# Patient Record
Sex: Female | Born: 2000 | Race: White | Hispanic: No | Marital: Single
Health system: Midwestern US, Community
[De-identification: ages and names within clinical notes are randomized; demographics above are authoritative.]

## PROBLEM LIST (undated history)

## (undated) DIAGNOSIS — J101 Influenza due to other identified influenza virus with other respiratory manifestations: Secondary | ICD-10-CM

## (undated) DIAGNOSIS — F329 Major depressive disorder, single episode, unspecified: Secondary | ICD-10-CM

## (undated) DIAGNOSIS — F32A Depression, unspecified: Secondary | ICD-10-CM

## (undated) HISTORY — DX: Depression, unspecified: F32.A

## (undated) HISTORY — PX: OTHER SURGICAL HISTORY: SHX169

## (undated) HISTORY — DX: Major depressive disorder, single episode, unspecified: F32.9

---
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
## 2022-02-25 NOTE — ED Notes (Signed)
Formatting of this note might be different from the original.  Pt name and dob verified.     Pt sts on Saturday that she had multiple episodes of vomiting. Since then the pt has continued to develop more cold/flu-like symptoms. Pt sts this morning when she woke up that her right eye was swollen shut. Pt's sts she can now see out of the eye.     NAD at this time. Skin w/p/d. RR are even and unlabored.   Electronically signed by Jannet Askew, RN at 02/25/2022  1:40 PM EDT

## 2022-02-25 NOTE — ED Provider Notes (Signed)
Formatting of this note is different from the original.     TRIAGE CHIEF COMPLAINT:  Chief Complaint   Patient presents with    URI    Vomiting     HPI: Leah Love is a 21 year old female who presents to the emergency department with complaints of cough and congestion.  Patient states that over the past 2 days she has had worsening of her seasonal allergies with increased amounts of nasal discharge as well as some nausea and vomiting.  States that she has a hard time sleeping due to how much she is coughing as well as her congestion.  She states she has been taking Benadryl as well as another allergy medicine that she does not know what is called.  She states also she has been working a lot working double shifts at work and not sleeping as much.  No complaints of fevers or chills.  No chest pain or trouble breathing.    External Records Reviewed: None    REVIEW OF SYSTEMS: Otherwise negative unless stated above    PAST MEDICAL HISTORY:   History reviewed. No pertinent past medical history.    FAMILY HISTORY:   History reviewed. No pertinent family history.    SOCIAL HISTORY:   Social History     Socioeconomic History    Marital status: Single     Spouse name: Not on file    Number of children: Not on file    Years of education: Not on file    Highest education level: Not on file   Occupational History    Not on file   Tobacco Use    Smoking status: Never    Smokeless tobacco: Never   Vaping Use    Vaping Use: Not on file   Substance and Sexual Activity    Alcohol use: Not Currently     Alcohol/week: 2.0 standard drinks     Types: 2 Standard drinks or equivalent per week    Drug use: Yes     Types: Marijuana    Sexual activity: Not on file   Other Topics Concern    Military Service No    Blood Transfusions No    Caffeine Concern No    Occupational Exposure No    Hobby Hazards No    Sleep Concern No    Stress Concern No    Weight Concern No    Special Diet No    Back Care No    Exercise Yes    Bike Helmet No     Seat Belt Yes    Self-Exams No   Social History Narrative          Social History:      Mother's Name and Occupation: Stage manager 31 Deceased    Mother's Education Level:      Father's Name and Occupation: Unknown    Father's Education Level:      Parent's Current Relationship:    Who does the child primarily live with? Maternal Grandmother      List all people living in child's household and relationship to child:Maternal Grandmother Maternal Uncle and Siblings      What is the current living environment (house, apartment, or homeless) and year dwelling built: House-[birth year]22      What is the current child care arrangement? None      Have there been any recent stresses in the family? Mother passed away 07/17/10     What is the source of  drinking water at the home where the child lives? Bottled/Purifier      Does anyone who lives in your house smoke? No         Social Determinants of Psychologist, prison and probation services Strain: Not on file   Food Insecurity: Not on file   Transportation Needs: Not on file   Physical Activity: Not on file   Stress: Not on file   Social Connections: Not on file   Intimate Partner Violence: Not on file   Housing Stability: Not on file     SURGICAL HISTORY:   History reviewed. No pertinent surgical history.    CURRENT MEDICATIONS:   No current facility-administered medications for this encounter.    Current Outpatient Medications:     vitamins multiple pediatric (FLINTSTONES)  Chew Tab, Take 1 Tab by mouth daily, Disp: , Rfl:     ALLERGIES: Patient has no known allergies.    PHYSICAL EXAM:  VITAL SIGNS:    Vitals:    02/25/22 1339   BP: 127/78   Pulse: 72   Resp: 16   Temp: 98.4 F (36.9 C)   SpO2: 98%     CONSTITUTIONAL: Awake, oriented, appears non-toxic  HENT: Atraumatic, normocephalic, oral mucosa pink and moist, airway patent. Nares patent without drainage. External ears normal.  EYES: Conjunctiva clear   NECK: Trachea midline, non-tender, supple  CARDIOVASCULAR: Normal heart rate,  Normal rhythm, No murmurs, rubs, gallops  PULMONARY/CHEST: Clear to auscultation, no rhonchi, wheezes, or rales. Symmetrical breath sounds. Non-tender.  ABDOMINAL: Non-distended, soft, non-tender - no rebound or guarding.    NEUROLOGIC: Non-focal, moving all four extremities, no gross sensory or motor deficits.  EXTREMITIES: No clubbing, cyanosis, or edema  SKIN: Warm, Dry, No erythema, No rash    ED COURSE / MEDICAL DECISION MAKING:  Leah Love is a 21 year old female who presents to the emergency department with complaints of URI type symptoms.  On arrival she is well-appearing and has reassuring vital signs.  On physical exam her oropharynx is moist, tonsils are not swollen, uvula is midline, tolerating secretions, no lymphadenopathy, lung sounds are clear bilaterally.  We will offer her treatment with Tylenol Motrin at home as well as prescription of Flonase.  We will also give her number to call for primary care doctor.  Concerns are for allergies versus URI.  Will discharge home in stable condition.      Independent Interpretation of Studies: None  Discussion of Management: None  Escalation/De-Escalation of Care: Appropriate for outpatient management  Billable Critical Care Time: None or <30 minutes    FINAL IMPRESSION:  1 --URI  2 --seasonal allergies  3 --     Electronically signed by: Dicky Doe, MD, 02/25/2022 2:40 PM    Electronically signed by Dicky Doe, MD at 02/25/2022  2:42 PM EDT

## 2022-02-25 NOTE — ED Notes (Signed)
Formatting of this note might be different from the original.  Patient discharged to home, alert and oriented, skin warm, dry and pink. Denies needs and or questions. Will follow-up as directed, patient encouraged to return for worsening or new symptoms or other concerns.  Discussed, educated and counseled with the patient, and any questioned answered and addressed.  Patient ambulated out by self.   Electronically signed by Isaias Sakai, RN at 02/25/2022  3:11 PM EDT

## 2022-06-27 ENCOUNTER — Inpatient Hospital Stay
Admit: 2022-06-27 | Discharge: 2022-06-27 | Disposition: A | Discharge status: Home / Self Care | Payer: MEDICAID | Attending: Emergency Medicine

## 2022-06-27 DIAGNOSIS — J101 Influenza due to other identified influenza virus with other respiratory manifestations: Secondary | ICD-10-CM

## 2022-06-27 LAB — COVID-19, RAPID: SARS-CoV-2, NAAT: NOT DETECTED

## 2022-06-27 LAB — INFLUENZA A/B, MOLECULAR
Influenza A Antigen: DETECTED — AB
Influenza B Antigen: NOT DETECTED

## 2022-06-27 MED ORDER — ACETAMINOPHEN 500 MG PO TABS
500 MG | ORAL_TABLET | Freq: Four times a day (QID) | ORAL | 0 refills | Status: AC | PRN
Start: 2022-06-27 — End: ?

## 2022-06-27 MED ORDER — IBUPROFEN 600 MG PO TABS
600 MG | ORAL_TABLET | Freq: Three times a day (TID) | ORAL | 0 refills | Status: AC | PRN
Start: 2022-06-27 — End: ?

## 2022-06-27 MED ORDER — IBUPROFEN 400 MG PO TABS
400 MG | Freq: Once | ORAL | Status: AC
Start: 2022-06-27 — End: 2022-06-27
  Administered 2022-06-27: 21:00:00 600 mg via ORAL

## 2022-06-27 MED ORDER — ACETAMINOPHEN 325 MG PO TABS
325 MG | Freq: Once | ORAL | Status: AC
Start: 2022-06-27 — End: 2022-06-27
  Administered 2022-06-27: 21:00:00 650 mg via ORAL

## 2022-06-27 MED FILL — IBUPROFEN 400 MG PO TABS: 400 MG | ORAL | Qty: 2

## 2022-06-27 MED FILL — ACETAMINOPHEN 325 MG PO TABS: 325 MG | ORAL | Qty: 2

## 2022-06-27 NOTE — ED Provider Notes (Signed)
Emergency Department Encounter    Patient: Leah Love  MRN: [id number]  DOB: [birth year]  Date of Evaluation: 06/27/2022  ED Provider:  Valerie Roys, DO    Triage Chief Complaint:   Cough and Generalized Body Aches    HOPI:  Leah Love is a 21 y.o. female with no chronic medical illnesses that presents to the emergency department complaining of 2 days of bodyaches sore throat runny nose earache.  Patient initially also had a productive cough.  Patient states she has not taken anything for the pain or cough.  She denies any sick contacts.  States no chest pain shortness of breath dysuria hematuria.  Patient here for evaluation.    ROS - see HPI, below listed is current ROS at time of my eval:  General:  No fevers, no chills, no weakness  Eyes:  No recent vison changes, no discharge  ENT: Positive for runny nose, earache, sore throat, nasal congestion, no hearing changes  Cardiovascular:  No chest pain, no palpitations  Respiratory:  No shortness of breath, positive for cough, no wheezing  Gastrointestinal:  No pain, no nausea, no vomiting, no diarrhea  Musculoskeletal:  No muscle pain, no joint pain  Skin:  No rash, no pruritis, no easy bruising  Neurologic:  No speech problems, no headache, no extremity numbness, no extremity tingling, no extremity weakness  Psychiatric:  No anxiety  Genitourinary:  No dysuria, no hematuria  Endocrine:  No unexpected weight gain, no unexpected weight loss  Extremities:  no edema, no pain    Past Medical History:   Diagnosis Date    Fracture     right wrist     No past surgical history on file.  No family history on file.  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    Sexual activity: Not on file   Other Topics Concern    Not on file   Social History Narrative    Not on file     Social Determinants of Health     Financial Resource Strain: Not on file   Food Insecurity: Not on file   Transportation Needs: Not on file   Physical Activity: Not on file   Stress: Not on file   Social Connections: Not on file   Intimate Partner Violence: Not on file   Housing Stability: Not on file     Current Facility-Administered Medications   Medication Dose Route Frequency Provider Last Rate Last Admin    ibuprofen (ADVIL;MOTRIN) tablet 600 mg  600 mg Oral Once Crystallynn Noorani Tiara, DO        acetaminophen (TYLENOL) tablet 650 mg  650 mg Oral Once Lenor Derrick Tiara, DO         No current outpatient  medications on file.     No Known Allergies    Nursing Notes Reviewed    Physical Exam:  Triage VS:    ED Triage Vitals [06/27/22 1442]   Enc Vitals Group      BP (!) 146/87      Pulse 99      Respirations 16      Temp 98.4 F (36.9 C)      Temp src       SpO2 99 %      Weight - Scale 66.2 kg (146 lb)      Height 1.575 m (5\' 2" )      Head Circumference       Peak Flow       Pain Score       Pain Loc       Pain Edu?       Excl. in [place]?        My pulse ox interpretation is - normal    General appearance:  No acute distress.  Nontoxic, non-ill-appearing  Skin:  Warm. Dry.   Eye:  Extraocular movements intact.     Ears, nose, mouth and throat:  Oral mucosa moist mild erythema posterior oropharynx no exudates, normal tympanic membranes bilaterally,  Neck:  Trachea midline.   Extremity:  No swelling.  Normal ROM     Heart:  Regular rate and rhythm, normal S1 & S2, no extra heart sounds.    Perfusion:  intact  Respiratory:  Lungs clear to auscultation bilaterally.  Respirations nonlabored.     Abdominal:  Normal bowel sounds.  Soft.   Nontender.  Non distended.  Back:  No CVA tenderness to palpation     Neurological:  Alert and oriented times 3.  No focal neuro deficits.             Psychiatric:  Appropriate    I have reviewed and interpreted all of the currently available lab results from this visit (if applicable):  Results for orders placed or performed during the hospital encounter of 06/27/22   COVID-19, Rapid    Specimen: Nasopharyngeal   Result Value Ref Range    Source NARES     SARS-CoV-2, NAAT NOT DETECTED NOT DETECTED   Influenza A/B, Molecular    Specimen: Nasal   Result Value Ref Range    Influenza A Antigen DETECTED (A) NOT DETECTED    Influenza B Antigen NOT DETECTED NOT DETECTED   Strep Screen Group A  - Throat    Specimen: Throat   Result Value Ref Range    Specimen THROAT     Special Requests NONE     Strep A Direct Screen NEGATIVE       Radiographs (if obtained):  Radiologist's Report Reviewed:  No results found.    EKG (if obtained): (All EKG's are interpreted by myself in the absence of a cardiologist)    Medications   ibuprofen (ADVIL;MOTRIN) tablet 600 mg (600 mg Oral Given 06/27/22 1543)   acetaminophen (TYLENOL) tablet 650 mg (650 mg Oral Given 06/27/22 1543)         MDM:  Leah Love is a 21 y.o. female with no chronic medical illnesses that presents to the emergency department complaining of 2 days of bodyaches sore throat runny nose earache.  Patient initially also had a productive cough.  Patient states she has not taken anything for the pain or cough.  She denies any sick contacts.  States no chest pain shortness of  breath dysuria hematuria.    On physical exam patient nontoxic non-ill-appearing no acute distress.  Patient patent oropharynx mild erythema no exudates, normal tympanic membranes bilaterally, normal nasal turbinates bilaterally.    Differential diagnose include COVID-19 virus infection influenza strep pharyngitis.    Patient was ordered Tylenol 650 mg p.o., ibuprofen 600 mg p.o., COVID influenza  and strep swabs.  Patient did get relief with above medication.  Patient positive for influenza A.  Discussed with patient she is positive.  She will be given a prescription for Tylenol 500 mg p.o. every 4 hours as needed for pain alternate with Motrin 600 mg p.o. every 6-8 hours as needed for pain.  Patient to increase p.o. fluids to prevent dehydration.  Patient is return immediately worsening symptoms.  All questions answered.  Patient discharged in stable condition.    Clinical Impression:  1. Influenza A      Disposition referral (if applicable):  Inge Rise, MD  [address]  [address]  [place] [address]  [phone]    Schedule an appointment as soon as possible for a visit in 4 days  To establish a primary care physician for reevaluation.  Call for an appointment.    [facility]  [address]  [place] [address]  [phone]  Go in 1 day  If symptoms worsen    Disposition medications (if applicable):  New Prescriptions    ACETAMINOPHEN (TYLENOL) 500 MG TABLET    Take 1 tablet by mouth 4 times daily as needed for Pain    IBUPROFEN (ADVIL;MOTRIN) 600 MG TABLET    Take 1 tablet by mouth 3 times daily as needed for Pain     ED Provider Disposition Time  DISPOSITION  4:11 PM        Comment: Please note this report has been produced using speech recognition software and may contain errors related to that system including errors in grammar, punctuation, and spelling, as well as words and phrases that may be inappropriate.  Efforts were made to edit the dictations.        Valerie Roys, DO  06/27/22 [phone]

## 2022-06-27 NOTE — Discharge Instructions (Signed)
Establish and follow-up with a primary care physician Dr. Posey Pronto for reevaluation.  Call for an appointment  Take Tylenol alternate with Motrin for any pain aches and fevers  Increase p.o. fluid to prevent dehydration  Return to the emergency department immediately any pain fever chills nausea vomiting dizzy lightheadedness or worsening symptoms.

## 2022-06-30 LAB — STREP SCREEN GROUP A THROAT
Culture: NORMAL
Strep A Direct Screen: NEGATIVE

## 2023-02-25 ENCOUNTER — Telehealth: Payer: Self-pay | Admitting: Family Medicine

## 2023-02-25 NOTE — Telephone Encounter (Signed)
Called patient to schedule next available cpe, no answer left Vm.

## 2023-03-19 ENCOUNTER — Inpatient Hospital Stay
Admit: 2023-03-19 | Discharge: 2023-03-19 | Disposition: A | Discharge status: Home / Self Care | Payer: MEDICAID | Attending: Student in an Organized Health Care Education/Training Program

## 2023-03-19 DIAGNOSIS — J069 Acute upper respiratory infection, unspecified: Secondary | ICD-10-CM

## 2023-03-19 DIAGNOSIS — U071 COVID-19: Secondary | ICD-10-CM

## 2023-03-19 LAB — COVID-19, RAPID: SARS-CoV-2, Rapid: DETECTED — AB

## 2023-03-19 MED ORDER — ONDANSETRON 4 MG PO TBDP
4 | Freq: Once | ORAL | Status: AC
Start: 2023-03-19 — End: 2023-03-19

## 2023-03-19 MED ORDER — ONDANSETRON HCL 4 MG PO TABS
4 | ORAL_TABLET | Freq: Three times a day (TID) | ORAL | 0 refills | Status: AC | PRN
Start: 2023-03-19 — End: ?

## 2023-03-19 MED ORDER — ACETAMINOPHEN 500 MG PO TABS
500 | ORAL | Status: AC
Start: 2023-03-19 — End: 2023-03-19

## 2023-03-19 MED ORDER — CETIRIZINE-PSEUDOEPHEDRINE ER 5-120 MG PO TB12
5-120 | ORAL_TABLET | Freq: Two times a day (BID) | ORAL | 0 refills | Status: AC
Start: 2023-03-19 — End: 2023-03-26

## 2023-03-19 MED ADMIN — ondansetron (ZOFRAN-ODT) disintegrating tablet 4 mg: 4 mg | ORAL | @ 14:00:00 | NDC 68462015740

## 2023-03-19 MED ADMIN — acetaminophen (TYLENOL) tablet 1,000 mg: 1000 mg | ORAL | @ 14:00:00 | NDC 00904673061

## 2023-03-19 MED FILL — ONDANSETRON 4 MG PO TBDP: 4 MG | ORAL | Qty: 1

## 2023-03-19 MED FILL — ACETAMINOPHEN EXTRA STRENGTH 500 MG PO TABS: 500 MG | ORAL | Qty: 2

## 2023-03-19 NOTE — Discharge Instructions (Addendum)
 Your COVID-19 test was positive    Marry: Here are some specific instructions about taking Tylenol  and/or ibuprofen .       acetaminophen  (Tylenol ) is used for fever and pain.  It comes in regular strength (325 mg) and extra strength (500 mg).  I typically recommend two of the ES (500 mg) tablets every 4-6 hours.  However, do not take more than 3 total doses (3,000 mg) in a 24-hour period. Also, do not take if you have any liver problems.        ibuprofen  (Motrin  or Advil ) is used for fever, pain, and/or inflammation.   It is considered an NSAID (Non-Steroidal Anti-Inflamatory Drug). They come in 200 mg tabs/capsules, and can be dosed two tablets (400 mg total) every 6-8 hours.               On rare occasion, NSAIDS can cause gastric (stomach) irritation such as gastritis, and even ulcers. So, avoid these if you develop upper abdominal discomfort or heartburn. Some people will take anti-ulcer medication (like Pepcid, Zantac, or Prilosec) and maybe TUMS, while taking NSAIDs to minimize any stomach irritation.

## 2023-03-19 NOTE — ED Provider Notes (Addendum)
 Emergency Department Encounter    Patient: Leah Love  MRN: [id number]  DOB: Nov 28, 2000  Date of Evaluation: 03/19/2023  ED Provider:  Maryagnes Coin, DO    Triage Chief Complaint:   Cough (Cough, congestion, chills. Symptoms starting at 0500 today. Reports family members sick also)    HOPI:  Leah Love is a 22 y.o. female that presents several days of congestion and cough.  Today with worsening body aches and nasal congestion.  Reports there is family members in the home from her boyfriend side that have all had similar illness.  Denies any chest pain or shortness of breath.  No abdominal pain but does report nausea and vomiting today as well.  No urinary symptoms.  No history of asthma.  Does vape occasionally.    MDM:    History from : Patient    Patient overall nontoxic-appearing no respiratory distress not hypoxic clear lung sounds.  Awake alert talking to me.  Symptoms consistent with viral URI with cough.  Nontender abdominal exam.  Will give her Zofran  for her nausea and she was having bodyaches given her Tylenol .  She would like a COVID test done and we will perform that here today.  Overall will recommend symptomatic treatment for viral URI at home.  Will send home with Zyrtec  pseudoephedrine  combination along with Zofran  as needed.  Primary care follow-up recommended.           Patient was given the following medications:  Medications   acetaminophen  (TYLENOL ) tablet 1,000 mg (1,000 mg Oral Given 03/19/23 1022)   ondansetron  (ZOFRAN -ODT) disintegrating tablet 4 mg (4 mg Oral Given 03/19/23 1022)       Discharge condition: stable    I am the Primary Clinician of Record.    Is this patient to be included in the SEP-1 Core Measure due to severe sepsis or septic shock?   No   Exclusion criteria - the patient is NOT to be included for SEP-1 Core Measure due to:  Viral etiology found or highly suspected (including COVID-19) without concomitant bacterial infection        ROS - see HPI, below listed is  current ROS at time of my eval:  systems reviewed and negative except as above.     Past Medical History:   Diagnosis Date    Fracture     right wrist     History reviewed. No pertinent surgical history.  History reviewed. No pertinent family history.  Social History     Socioeconomic History    Marital status: Single     Spouse name: Not on file    Number of children: Not on file    Years of education: Not on file    Highest education level: Not on file   Occupational History    Not on file   Tobacco Use    Smoking status: Every Day     Types: E-Cigarettes    Smokeless tobacco: Never   Vaping Use    Vaping status: Every Day    Substances: Flavoring   Substance and Sexual Activity    Alcohol use: Yes     Comment: occ    Drug use: Yes     Types: Marijuana Oda)     Comment: daily    Sexual activity: Not on file   Other Topics Concern    Not on file   Social History Narrative    Not on file     Social Determinants of Health  Financial Resource Strain: Not on file   Food Insecurity: Not on file   Transportation Needs: Not on file   Physical Activity: Not on file   Stress: Not on file   Social Connections: Not on file   Intimate Partner Violence: Not on file   Housing Stability: Not on file     No current facility-administered medications for this encounter.     Current Outpatient Medications   Medication Sig Dispense Refill    ondansetron  (ZOFRAN ) 4 MG tablet Take 1 tablet by mouth 3 times daily as needed for Nausea or Vomiting 15 tablet 0    cetirizine -psuedoephedrine (ZYRTEC -D) 5-120 MG per extended release tablet Take 1 tablet by mouth 2 times daily for 7 days 14 tablet 0    ibuprofen  (ADVIL ;MOTRIN ) 600 MG tablet Take 1 tablet by mouth 3 times daily as needed for Pain 30 tablet 0    acetaminophen  (TYLENOL ) 500 MG tablet Take 1 tablet by mouth 4 times daily as needed for Pain 30 tablet 0     No Known Allergies    Physical Exam:  Triage VS:      ED Triage Vitals [03/19/23 1004]   Encounter Vitals Group      BP  126/82      Systolic BP Percentile       Diastolic BP Percentile       Pulse 84      Respirations 18      Temp 98.5 F (36.9 C)      Temp src       SpO2 100 %      Weight - Scale 68 kg (150 lb)      Height 1.575 m (5' 2)      Head Circumference       Peak Flow       Pain Score       Pain Loc       Pain Education       Exclude from Growth Chart           Physical Exam  Vitals and nursing note reviewed.   Constitutional:       General: She is not in acute distress.     Appearance: She is not toxic-appearing.   HENT:      Head: Normocephalic and atraumatic.      Nose: Nose normal.      Mouth/Throat:      Mouth: Mucous membranes are moist.   Eyes:      General: No scleral icterus.  Cardiovascular:      Rate and Rhythm: Normal rate and regular rhythm.   Pulmonary:      Effort: No respiratory distress.      Breath sounds: No wheezing, rhonchi or rales.   Musculoskeletal:      Right lower leg: No edema.      Left lower leg: No edema.   Skin:     General: Skin is warm.      Capillary Refill: Capillary refill takes less than 2 seconds.   Neurological:      Mental Status: She is alert and oriented to person, place, and time.         I have reviewed and interpreted all of the currently available lab results from this visit (if applicable):  Results for orders placed or performed during the hospital encounter of 03/19/23   COVID-19, Rapid    Specimen: Nasopharyngeal Swab   Result Value Ref Range    Specimen Description .NASOPHARYNGEAL  SWAB     SARS-CoV-2, Rapid DETECTED (A) Not Detected      Radiographs (if obtained):  Radiologist's Report Reviewed:  No results found.      Clinical Impression:  1. Viral URI with cough    2. Nausea and vomiting, unspecified vomiting type    3. COVID-19 virus detected      Disposition referral (if applicable):  [facility]  [address]  [place]  [address]  [phone]  Go today  If symptoms worsen    Disposition medications (if applicable):  New Prescriptions     CETIRIZINE -PSUEDOEPHEDRINE (ZYRTEC -D) 5-120 MG PER EXTENDED RELEASE TABLET    Take 1 tablet by mouth 2 times daily for 7 days    ONDANSETRON  (ZOFRAN ) 4 MG TABLET    Take 1 tablet by mouth 3 times daily as needed for Nausea or Vomiting     ED Provider Disposition Time  DISPOSITION Decision To Discharge 03/19/2023 10:47:53 AM  Condition at Disposition: Stable      Comment: Please note this report has been produced using speech recognition software and may contain errors related to that system including errors in grammar, punctuation, and spelling, as well as words and phrases that may be inappropriate.  Efforts were made to edit the dictations.        Briell Paulette, DO  03/19/23 1018       Thomes Burak, DO  03/19/23 1048

## 2023-03-19 NOTE — ED Notes (Signed)
 Discharge instructions reviewed with patient. Medications and follow up were discussed. Patient denies further questions and verbalizes understanding
